# Patient Record
Sex: Male | Born: 1955
Health system: Southern US, Community
[De-identification: ages and names within clinical notes are randomized; demographics above are authoritative.]

## PROBLEM LIST (undated history)

## (undated) HISTORY — PX: APPENDECTOMY: SHX54

---

## 2002-02-11 ENCOUNTER — Encounter (INDEPENDENT_AMBULATORY_CARE_PROVIDER_SITE_OTHER): Payer: Self-pay | Admitting: *Deleted

## 2002-02-12 ENCOUNTER — Encounter (INDEPENDENT_AMBULATORY_CARE_PROVIDER_SITE_OTHER): Payer: Self-pay | Admitting: Specialist

## 2002-02-12 ENCOUNTER — Inpatient Hospital Stay (HOSPITAL_COMMUNITY): Admission: EM | Admit: 2002-02-12 | Discharge: 2002-02-16 | Payer: Self-pay | Admitting: Emergency Medicine

## 2006-12-05 ENCOUNTER — Ambulatory Visit: Payer: Self-pay | Admitting: Gastroenterology

## 2009-01-21 ENCOUNTER — Encounter (INDEPENDENT_AMBULATORY_CARE_PROVIDER_SITE_OTHER): Payer: Self-pay | Admitting: *Deleted

## 2009-03-10 DIAGNOSIS — E291 Testicular hypofunction: Secondary | ICD-10-CM | POA: Insufficient documentation

## 2010-08-05 NOTE — Op Note (Signed)
NAMEWISSAM, RESOR                            ACCOUNT NO.:  [id number]   MEDICAL RECORD NO.:  [id number]                   PATIENT TYPE:  INP   LOCATION:  0284                                 FACILITY:  [facility]   PHYSICIAN:  Lorre Munroe., M.D.            DATE OF BIRTH:  May 26, 1955   DATE OF PROCEDURE:  02/12/2002  DATE OF DISCHARGE:                                 OPERATIVE REPORT   PREOPERATIVE DIAGNOSIS:  Ruptured appendix.   POSTOPERATIVE DIAGNOSIS:  Ruptured appendix.   OPERATION PERFORMED:  Appendectomy.   SURGEON:  Lebron Conners, M.D.   ANESTHESIA:  General.   DESCRIPTION OF PROCEDURE:  After the patient had monitoring and anesthesia,  the nurse tried to insert a Foley catheter and it was difficult and there  was blood noted to be coming out through the catheter.  Attempts at bladder  catheterization were abandoned.  After routine preparation and draping of  the abdomen, I made a lower midline incision and carefully entered the  abdomen.  I found that the bladder was not distended and there was a lot of  cloudy foul-smelling fluid present.  I took a culture of it and evacuated  the fluid.  I palpated the sigmoid colon and it felt normal.  I palpated  into the right lower quadrant and adherent to the pelvic sidewall and  posterior pelvis there was an object which felt like a severely inflamed  appendix.  The patient had some small bowel distention and tremendous  distention of the colon which made exposure quite difficult.  I packed the  cecum upward and was able to visualize the inflamed structure and found it  to be a gangrenous appendix.  I cut the adhesions around it and mobilized it  a bit but the dissection was very hard and so I decided to mobilize it a  minimal amount to be sure that I had it up off the retroperitoneum and I did  not mobilize the cecum and right colon.  I passed a 2-0 silk tie around the  appendix at the base and ligated the appendix.  I  similarly ligated the  mesoappendix with two passes of silk ties and then I amputated the appendix  and the mesoappendix.  It appeared that the closure was secure.  Hemostasis  was good.  I irrigated the area and suctioned it dry.  I have made sure that  I had all of the purulent fluid out of the pelvis and both gutters.  I then  closed the abdomen with a running #1 PDS starting the suture at each end and  tying in the middle.  We copiously irrigated the subcutaneous tissues and  closed the skin with staples and packed gauze between the staples down into  the deep subcutaneous tissue.  Sponge, needle and instrument counts were  correct.  The patient tolerated the operation well.  Lorre Munroe., M.D.    WB/MEDQ  D:  02/12/2002  T:  02/12/2002  Job:  [id number]

## 2010-08-05 NOTE — Discharge Summary (Signed)
   NAMEKRESTON, AHRENDT                            ACCOUNT NO.:  [id number]   MEDICAL RECORD NO.:  [id number]                   PATIENT TYPE:  INP   LOCATION:  0284                                 FACILITY:  [facility]   PHYSICIAN:  Lorre Munroe., M.D.            DATE OF BIRTH:  07-21-1955   DATE OF ADMISSION:  02/11/2002  DATE OF DISCHARGE:  02/16/2002                                 DISCHARGE SUMMARY   HISTORY OF PRESENT ILLNESS:  The patient is a generally healthy 55 year old  man with about a 24-hour history of epigastric and right lower quadrant  pain.  White count was elevated.  Urinalysis was negative.  He was felt to  have a condition consistent with acute appendicitis.  His general health is  good.   HOSPITAL COURSE:  On February 12, 2002, he was taken to the operating room  and was found to have a gangrenous and perforated appendix with peritonitis.  He underwent appendectomy.  Postoperatively he had fever and tachycardia but  gradually improved.  He had slow return of GI function.  White count fell to  normal.  By February 16, 2002, his abdomen was soft, and he felt well enough  to go home on oral pain medicine.  He was sent home on Augmentin 875 mg  twice a day for four days.  He is to see me in follow-up in the office.  Pathology confirmed the expectation of acute suppurative appendicitis.   DIAGNOSIS:  Acute appendicitis with perforation, abscess, and peritonitis.   OPERATION:  Appendectomy.   CONDITION ON DISCHARGE:  Improved.                                               Lorre Munroe., M.D.    WB/MEDQ  D:  04/01/2002  T:  04/01/2002  Job:  [id number]

## 2018-09-28 ENCOUNTER — Other Ambulatory Visit: Payer: Self-pay

## 2018-09-28 ENCOUNTER — Emergency Department (HOSPITAL_COMMUNITY): Payer: BLUE CROSS/BLUE SHIELD

## 2018-09-28 ENCOUNTER — Emergency Department (HOSPITAL_COMMUNITY)
Admission: EM | Admit: 2018-09-28 | Discharge: 2018-09-28 | Disposition: A | Discharge status: Home / Self Care | Payer: BLUE CROSS/BLUE SHIELD | Attending: Emergency Medicine | Admitting: Emergency Medicine

## 2018-09-28 ENCOUNTER — Encounter (HOSPITAL_COMMUNITY): Payer: Self-pay

## 2018-09-28 DIAGNOSIS — U071 COVID-19: Secondary | ICD-10-CM | POA: Diagnosis not present

## 2018-09-28 DIAGNOSIS — R0602 Shortness of breath: Secondary | ICD-10-CM | POA: Diagnosis present

## 2018-09-28 DIAGNOSIS — Z79899 Other long term (current) drug therapy: Secondary | ICD-10-CM | POA: Insufficient documentation

## 2018-09-28 LAB — SARS CORONAVIRUS 2 BY RT PCR (HOSPITAL ORDER, PERFORMED IN ~~LOC~~ HOSPITAL LAB): SARS Coronavirus 2: POSITIVE — AB

## 2018-09-28 NOTE — ED Provider Notes (Addendum)
[facility] Provider Note   CSN: [id number] Arrival date & time: 09/28/18  1308    History   Chief Complaint Chief Complaint  Patient presents with   Shortness of Breath    HPI Ivan Wagner is a 63 y.o. male presents today for concern of COVID-19 infection.  Patient reports that his wife has recently tested positive for COVID-19 and she is [facility] and is undergoing treatment.  Patient reports that approximately 1 week ago he developed a viral-like illness with low-grade fever, cough, shortness of breath, decreased appetite and diarrhea.  Patient reports that the symptoms continued for 3-4 days before beginning to improve on 09/26/2018.  Patient reports that as of yesterday 09/27/2018 his symptoms have completely improved he is no longer short of breath, cough has improved and diarrhea has improved.  He has been eating and drinking again without difficulty.  Patient reports that he is only here in the emergency department at request of his children he wanted to be checked out.  Of note patient reports that he has been using a home pulse ox throughout his illness he noted that on 09/23/2018 his SPO2 was 92-93% on room air however has subsequently resolved and as of last night he was 100% on room air.     HPI  History reviewed. No pertinent past medical history.  Patient Active Problem List   Diagnosis Date Noted   HYPOGONADISM 03/10/2009    Past Surgical History:  Procedure Laterality Date   APPENDECTOMY        Home Medications    Prior to Admission medications   Medication Sig Start Date End Date Taking? Authorizing Provider  Acetaminophen (TYLENOL PO) Take 1-2 tablets by mouth every 6 (six) hours as needed (pain/fever).    Yes [provider]  chlorpheniramine (CHLOR-TRIMETON) 4 MG tablet Take 4 mg by mouth at bedtime.   Yes [provider]  meloxicam (MOBIC) 15 MG tablet  Take 7.5 mg by mouth daily as needed for pain (inflammation).   Yes [provider]    Family History History reviewed. No pertinent family history.  Social History Social History   Tobacco Use   Smoking status: Unknown If Ever Smoked  Substance Use Topics   Alcohol use: Not Currently   Drug use: Not Currently     Allergies   Ramelteon   Review of Systems Review of Systems  Constitutional: Negative.  Negative for chills and fever.  Respiratory: Negative.  Negative for cough (Improved) and shortness of breath (Improved).   Cardiovascular: Negative.  Negative for chest pain, palpitations and leg swelling.  Gastrointestinal: Negative.  Negative for abdominal pain, diarrhea (Improving), nausea and vomiting.  Musculoskeletal: Negative.  Negative for arthralgias (Improved), myalgias (Improved), neck pain and neck stiffness.  Neurological: Negative.  Negative for weakness and headaches.  All other systems reviewed and are negative.  Physical Exam Updated Vital Signs BP 105/77 (BP Location: Right Arm)    Pulse 76    Temp 98.6 F (37 C) (Oral)    Resp 16    Ht 5\' 10"  (1.778 m)    Wt 83.9 kg    SpO2 98%    BMI 26.54 kg/m   Physical Exam Constitutional:      General: He is not in acute distress.    Appearance: Normal appearance. He is well-developed. He is not ill-appearing or diaphoretic.  HENT:     Head: Normocephalic and atraumatic.  Right Ear: External ear normal.     Left Ear: External ear normal.     Nose: Nose normal.  Eyes:     General: Vision grossly intact. Gaze aligned appropriately.     Pupils: Pupils are equal, round, and reactive to light.  Neck:     Musculoskeletal: Normal range of motion.     Trachea: Trachea and phonation normal. No tracheal deviation.  Cardiovascular:     Rate and Rhythm: Normal rate and regular rhythm.     Pulses:          Dorsalis pedis pulses are 2+ on the right side and 2+ on the left side.     Heart sounds: Normal  heart sounds.  Pulmonary:     Effort: Pulmonary effort is normal. No accessory muscle usage or respiratory distress.     Breath sounds: Normal breath sounds and air entry. No wheezing or rhonchi.  Chest:     Chest wall: No deformity, tenderness or crepitus.  Abdominal:     General: Bowel sounds are normal. There is no distension.     Palpations: Abdomen is soft.     Tenderness: There is no abdominal tenderness. There is no guarding or rebound.  Musculoskeletal: Normal range of motion.     Right lower leg: Normal. He exhibits no tenderness. No edema.     Left lower leg: Normal. He exhibits no tenderness. No edema.  Feet:     Right foot:     Protective Sensation: 3 sites tested.     Left foot:     Protective Sensation: 3 sites tested. 3 sites sensed.  Skin:    General: Skin is warm and dry.     Capillary Refill: Capillary refill takes less than 2 seconds.  Neurological:     Mental Status: He is alert.     GCS: GCS eye subscore is 4. GCS verbal subscore is 5. GCS motor subscore is 6.     Comments: Speech is clear and goal oriented, follows commands Major Cranial nerves without deficit, no facial droop Moves extremities without ataxia, coordination intact  Psychiatric:        Behavior: Behavior normal.    ED Treatments / Results  Labs (all labs ordered are listed, but only abnormal results are displayed) Labs Reviewed  SARS CORONAVIRUS 2 (HOSPITAL ORDER, [facility]FOR[facility])    EKG None  Radiology Dg Chest Port 1 View  Result Date: 09/28/2018 CLINICAL DATA:  Fever, cough and shortness of breath for 8 days. Possible COVID-19 exposure. EXAM: PORTABLE CHEST 1 VIEW COMPARISON:  None. FINDINGS: Minimal linear atelectasis or scar is seen in the lower lung zones bilaterally. No consolidative process, pneumothorax or effusion. Heart size is normal. No acute or focal bony abnormality. IMPRESSION: No acute disease. Electronically Signed   By: Drusilla Kannerhomas  Dalessio M.D.    On: 09/28/2018 15:10    Procedures Procedures (including critical care time)  Medications Ordered in ED Medications - No data to display   Initial Impression / Assessment and Plan / ED Course  I have reviewed the triage vital signs and the nursing notes.  Pertinent labs & imaging results that were available during my care of the patient were reviewed by me and considered in my medical decision making (see chart for details).    Patient with known COVID-19 positive contact reports today after his recent viral-like illness has improved.  He had a few days of shortness of breath, cough, fever and diarrhea all  of which have resolved.  He has not had any antipyretics since last night and on arrival he is afebrile.  No tachycardia or hypoxia on room air.  Lungs clear to auscultation bilaterally, heart regular rate and rhythm, abdomen soft nontender and without peritoneal signs, neurovascular intact to all 4 extremities, no signs of DVT.  Long conversation was held with the patient without evidence of shortness of breath SPO2 greater than 97% throughout visit.  Plan of care is to obtain screening chest x-ray as well as COVID-19 test.  Anticipate discharge with self-isolation.  CXR:  IMPRESSION:  No acute disease.   COVID 19: Positive --------- As patient is improved and has reassuring chest x-ray, no tachycardia or hypoxia on room air there is no indication for admission at this time.  Patient will be discharged with PCP follow-up via tele-visit and return precautions.  He has no complaints at this time.  Patient's DTOIZ-12 is positive today, he has been informed of this.  I have informed the patient that he should continue practicing self-isolation for the next 14 days after symptom free to avoid potential spread of this virus. I also informed him that all persons that he and his wife has come in contact with should also practice self isolation and quarantining for 2 weeks to avoid potential  spread.  Ivan Wagner was evaluated in Emergency Department on 09/28/2018 for the symptoms described in the history of present illness. He was evaluated in the context of the global COVID-19 pandemic, which necessitated consideration that the patient might be at risk for infection with the SARS-CoV-2 virus that causes COVID-19. Institutional protocols and algorithms that pertain to the evaluation of patients at risk for COVID-19 are in a state of rapid change based on information released by regulatory bodies including the CDC and federal and state organizations. These policies and algorithms were followed during the patient's care in the ED.  At this time there does not appear to be any evidence of an acute emergency medical condition and the patient appears stable for discharge with appropriate outpatient follow up. Diagnosis was discussed with patient who verbalizes understanding of care plan and is agreeable to discharge. I have discussed return precautions with patient who verbalizes understanding of return precautions. Patient encouraged to follow-up with their PCP. All questions answered.  Patient has been discharged in good condition.  Patient's case discussed with Dr. Maryan Rued who agrees with plan to discharge with follow-up.   Note: Portions of this report may have been transcribed using voice recognition software. Every effort was made to ensure accuracy; however, inadvertent computerized transcription errors may still be present. Final Clinical Impressions(s) / ED Diagnoses   Final diagnoses:  Close Exposure to Covid-19 Virus  Viral illness    ED Discharge Orders    None       Deliah Boston, PA-C 09/28/18 1823    Deliah Boston, PA-C 09/28/18 1836    Blanchie Dessert, MD 09/28/18 [phone]

## 2018-09-28 NOTE — ED Provider Notes (Signed)
MSE was initiated and I personally evaluated the patient and placed orders (if any) at  2:44 PM on September 28, 2018.  The patient appears stable so that the remainder of the MSE may be completed by another provider.  Patient's vital signs are reviewed and stable.  He is being evaluated for covert exposure with symptoms that are improving at this time but concern for age and exposure.  Initiate chest x-ray and COVID testing.  Patient stable for full evaluation by oncoming provider.   Charlesetta Shanks, MD 09/28/18 1445

## 2018-09-28 NOTE — ED Triage Notes (Signed)
Pt arrives POV for eval of SOB, loss of appetite. Pt reports wife is covid+ at [facility], but improving. Pt reports sx since last Sunday, but states he is feeling better today. Endorses return of appetite and improving breahting. States his kids wanted him to come in and get "checked out". Pt in [facility], resps e/u. Denies any other complaints at this time

## 2018-09-28 NOTE — Discharge Instructions (Addendum)
You have been diagnosed today with COVID-19 virus.  At this time there does not appear to be the presence of an emergent medical condition, however there is always the potential for conditions to change. Please read and follow the below instructions.  Please return to the Emergency Department immediately for any new or worsening symptoms. Please be sure to follow up with your Primary Care Provider within one week regarding your visit today; please call their office to schedule an appointment even if you are feeling better for a follow-up visit. Please continue practicing self isolation and quarantining the next 2 weeks to avoid potential spread of this virus.  Please follow-up with your primary care provider via telephone visit this week for follow-up.  Return to the emergency department immediately for any new or worsening symptoms.  Please be sure that close contacts are informed and aware that they should continue self-isolation as well for 2 weeks and follow-up with their primary care providers via telephone visit.  Get help right away if: You feel pain or pressure in your chest. You have shortness of breath. You faint or feel like you will faint. You keep throwing up (vomiting). You feel confused. Any new/concerning or worsening symptoms  Please read the additional information packets attached to your discharge summary. =================== If you live with, or provide care at home for, a person confirmed to have, or being evaluated for, COVID-19 infection please follow these guidelines to prevent infection:  Follow healthcare providers instructions Make sure that you understand and can help the patient follow any healthcare provider instructions for all care.  Provide for the patients basic needs You should help the patient with basic needs in the home and provide support for getting groceries, prescriptions, and other personal needs.  Monitor the patients symptoms If they are  getting sicker, call his or her medical provider a  This will help the healthcare providers office take steps to keep other people from getting infected. Ask the healthcare provider to call the local or state health department.  Limit the number of people who have contact with the patient If possible, have only one caregiver for the patient. Other household members should stay in another home or place of residence. If this is not possible, they should stay in another room, or be separated from the patient as much as possible. Use a separate bathroom, if available. Restrict visitors who do not have an essential need to be in the home.  Keep older adults, very young children, and other sick people away from the patient Keep older adults, very young children, and those who have compromised immune systems or chronic health conditions away from the patient. This includes people with chronic heart, lung, or kidney conditions, diabetes, and cancer.  Ensure good ventilation Make sure that shared spaces in the home have good air flow, such as from an air conditioner or an opened window, weather permitting.  Wash your hands often Wash your hands often and thoroughly with soap and water for at least 20 seconds. You can use an alcohol based hand sanitizer if soap and water are not available and if your hands are not visibly dirty. Avoid touching your eyes, nose, and mouth with unwashed hands. Use disposable paper towels to dry your hands. If not available, use dedicated cloth towels and replace them when they become wet.  Wear a facemask and gloves Wear a disposable facemask at all times in the room and gloves when you touch or have contact with  the patients blood, body fluids, and/or secretions or excretions, such as sweat, saliva, sputum, nasal mucus, vomit, urine, or feces.  Ensure the mask fits over your nose and mouth tightly, and do not touch it during use. Throw out disposable facemasks and  gloves after using them. Do not reuse. Wash your hands immediately after removing your facemask and gloves. If your personal clothing becomes contaminated, carefully remove clothing and launder. Wash your hands after handling contaminated clothing. Place all used disposable facemasks, gloves, and other waste in a lined container before disposing them with other household waste. Remove gloves and wash your hands immediately after handling these items.  Do not share dishes, glasses, or other household items with the patient Avoid sharing household items. You should not share dishes, drinking glasses, cups, eating utensils, towels, bedding, or other items After the person uses these items, you should wash them thoroughly with soap and water.  Wash laundry thoroughly Immediately remove and wash clothes or bedding that have blood, body fluids, and/or secretions or excretions, such as sweat, saliva, sputum, nasal mucus, vomit, urine, or feces, on them. Wear gloves when handling laundry from the patient. Read and follow directions on labels of laundry or clothing items and detergent. In general, wash and dry with the warmest temperatures recommended on the label.  Clean all areas the individual has used often Clean all touchable surfaces, such as counters, tabletops, doorknobs, bathroom fixtures, toilets, phones, keyboards, tablets, and bedside tables, every day. Also, clean any surfaces that may have blood, body fluids, and/or secretions or excretions on them. Wear gloves when cleaning surfaces the patient has come in contact with. Use a diluted bleach solution (e.g., dilute bleach with 1 part bleach and 10 parts water) or a household disinfectant with a label that says EPA-registered for coronaviruses. To make a bleach solution at home, add 1 tablespoon of bleach to 1 quart (4 cups) of water. For a larger supply, add  cup of bleach to 1 gallon (16 cups) of water. Read labels of cleaning products and  follow recommendations provided on product labels. Labels contain instructions for safe and effective use of the cleaning product including precautions you should take when applying the product, such as wearing gloves or eye protection and making sure you have good ventilation during use of the product. Remove gloves and wash hands immediately after cleaning.  Monitor yourself for signs and symptoms of illness Caregivers and household members are considered close contacts, should monitor their health, and will be asked to limit movement outside of the home to the extent possible. Follow the monitoring steps for close contacts listed on the symptom monitoring form.   ? If you have additional questions, contact your local health department or call the epidemiologist on call at [phone] (available 24/7). ? This guidance is subject to change. For the most up-to-date guidance from [facility], please refer to their website: TripMetro.huhttps://www.cdc.gov/coronavirus/2019-ncov/hcp/guidance-prevent-spread.html

## 2018-10-07 ENCOUNTER — Other Ambulatory Visit: Payer: Self-pay | Admitting: *Deleted

## 2018-10-07 DIAGNOSIS — Z20822 Contact with and (suspected) exposure to covid-19: Secondary | ICD-10-CM

## 2018-10-07 NOTE — Progress Notes (Signed)
[id number] 

## 2018-10-07 NOTE — Addendum Note (Signed)
Addended by: Jervon Ream M on: 10/07/2018 08:52 PM   Modules accepted: Orders  

## 2018-10-09 LAB — NOVEL CORONAVIRUS, NAA: SARS-CoV-2, NAA: NOT DETECTED

## 2020-06-19 IMAGING — DX PORTABLE CHEST - 1 VIEW
1 series · 1 of 1 positions shown · non-contrast
Comparison: None.

CLINICAL DATA: Fever, cough and shortness of breath for 8 days.
Possible 4XOS0-V5 exposure.

EXAM:
PORTABLE CHEST 1 VIEW

[chest ap]
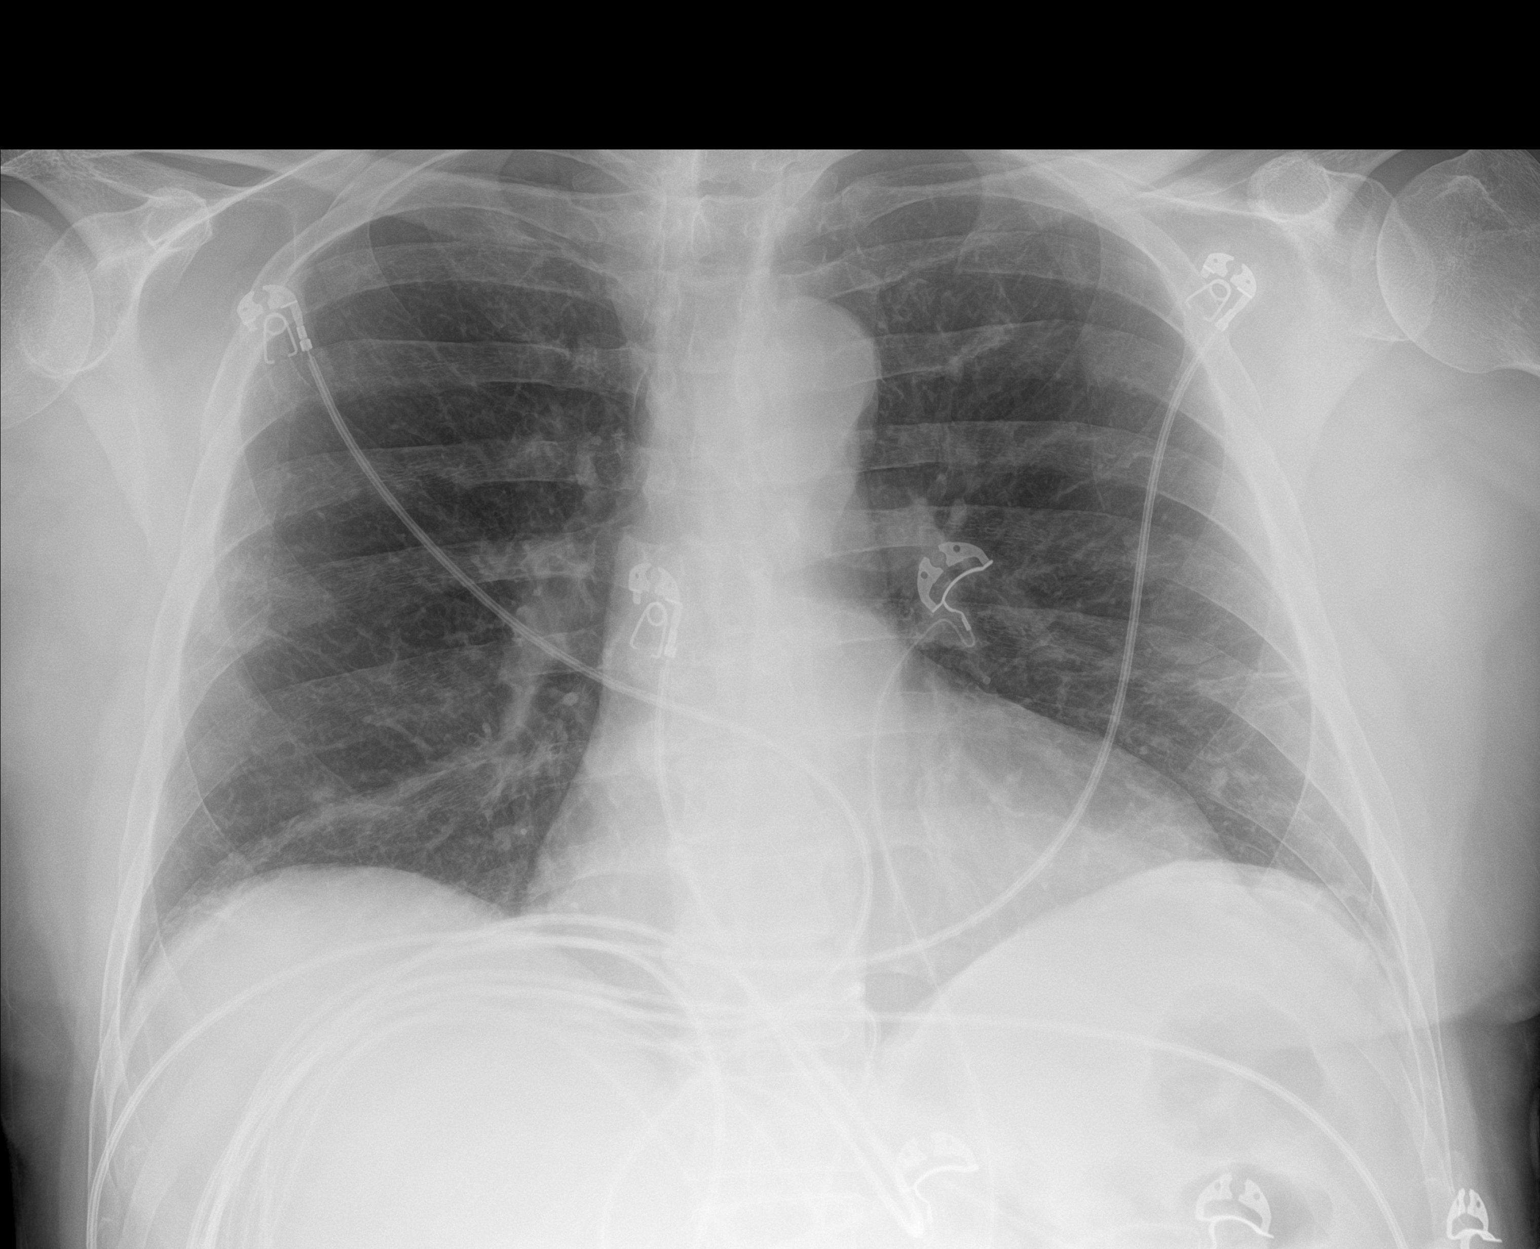

[1 of 1 positions shown; findings below may reference images not displayed]

FINDINGS: Minimal linear atelectasis or scar is seen in the lower lung zones
bilaterally. No consolidative process, pneumothorax or effusion.
Heart size is normal. No acute or focal bony abnormality.
IMPRESSION: No acute disease.

## 2022-12-04 DIAGNOSIS — M5431 Sciatica, right side: Secondary | ICD-10-CM | POA: Diagnosis not present

## 2022-12-04 DIAGNOSIS — R202 Paresthesia of skin: Secondary | ICD-10-CM | POA: Diagnosis not present

## 2022-12-04 DIAGNOSIS — Z8547 Personal history of malignant neoplasm of testis: Secondary | ICD-10-CM | POA: Diagnosis not present

## 2022-12-04 DIAGNOSIS — R030 Elevated blood-pressure reading, without diagnosis of hypertension: Secondary | ICD-10-CM | POA: Diagnosis not present

## 2022-12-04 DIAGNOSIS — D649 Anemia, unspecified: Secondary | ICD-10-CM | POA: Diagnosis not present

## 2022-12-04 DIAGNOSIS — Z125 Encounter for screening for malignant neoplasm of prostate: Secondary | ICD-10-CM | POA: Diagnosis not present

## 2022-12-04 DIAGNOSIS — Z1211 Encounter for screening for malignant neoplasm of colon: Secondary | ICD-10-CM | POA: Diagnosis not present

## 2022-12-04 DIAGNOSIS — Z0000 Encounter for general adult medical examination without abnormal findings: Secondary | ICD-10-CM | POA: Diagnosis not present

## 2022-12-04 DIAGNOSIS — Z136 Encounter for screening for cardiovascular disorders: Secondary | ICD-10-CM | POA: Diagnosis not present

## 2023-01-17 DIAGNOSIS — M546 Pain in thoracic spine: Secondary | ICD-10-CM | POA: Diagnosis not present

## 2023-01-17 DIAGNOSIS — M542 Cervicalgia: Secondary | ICD-10-CM | POA: Diagnosis not present

## 2023-01-17 DIAGNOSIS — M62830 Muscle spasm of back: Secondary | ICD-10-CM | POA: Diagnosis not present

## 2023-01-17 DIAGNOSIS — M9902 Segmental and somatic dysfunction of thoracic region: Secondary | ICD-10-CM | POA: Diagnosis not present

## 2023-01-17 DIAGNOSIS — M62838 Other muscle spasm: Secondary | ICD-10-CM | POA: Diagnosis not present

## 2023-01-17 DIAGNOSIS — M9901 Segmental and somatic dysfunction of cervical region: Secondary | ICD-10-CM | POA: Diagnosis not present

## 2023-01-17 DIAGNOSIS — M9903 Segmental and somatic dysfunction of lumbar region: Secondary | ICD-10-CM | POA: Diagnosis not present

## 2023-01-17 DIAGNOSIS — M545 Low back pain, unspecified: Secondary | ICD-10-CM | POA: Diagnosis not present

## 2023-12-12 DIAGNOSIS — Z1211 Encounter for screening for malignant neoplasm of colon: Secondary | ICD-10-CM | POA: Diagnosis not present

## 2023-12-12 DIAGNOSIS — E781 Pure hyperglyceridemia: Secondary | ICD-10-CM | POA: Diagnosis not present

## 2023-12-12 DIAGNOSIS — R202 Paresthesia of skin: Secondary | ICD-10-CM | POA: Diagnosis not present

## 2023-12-12 DIAGNOSIS — Z8619 Personal history of other infectious and parasitic diseases: Secondary | ICD-10-CM | POA: Diagnosis not present

## 2023-12-12 DIAGNOSIS — Z125 Encounter for screening for malignant neoplasm of prostate: Secondary | ICD-10-CM | POA: Diagnosis not present

## 2023-12-12 DIAGNOSIS — R030 Elevated blood-pressure reading, without diagnosis of hypertension: Secondary | ICD-10-CM | POA: Diagnosis not present

## 2023-12-12 DIAGNOSIS — Z0000 Encounter for general adult medical examination without abnormal findings: Secondary | ICD-10-CM | POA: Diagnosis not present

## 2023-12-12 DIAGNOSIS — R739 Hyperglycemia, unspecified: Secondary | ICD-10-CM | POA: Diagnosis not present

## 2023-12-12 DIAGNOSIS — Z8547 Personal history of malignant neoplasm of testis: Secondary | ICD-10-CM | POA: Diagnosis not present

## 2023-12-12 DIAGNOSIS — M545 Low back pain, unspecified: Secondary | ICD-10-CM | POA: Diagnosis not present
